# Patient Record
Sex: Female | Born: 1995 | Race: White | Hispanic: No | Marital: Single | Smoking: Never smoker
Health system: Southern US, Community
[De-identification: ages and names within clinical notes are randomized; demographics above are authoritative.]

---

## 2019-04-23 ENCOUNTER — Ambulatory Visit: Payer: Self-pay | Attending: Internal Medicine

## 2019-04-23 DIAGNOSIS — Z23 Encounter for immunization: Secondary | ICD-10-CM

## 2019-04-23 NOTE — Progress Notes (Signed)
   Covid-19 Vaccination Clinic  Name:  Sarah Sellers    MRN: [id number] DOB: July 23, 1995  04/23/2019  Sarah Sellers was observed post Covid-19 immunization for 15 minutes without incident. She was provided with Vaccine Information Sheet and instruction to access the V-Safe system.   Sarah Sellers was instructed to call 911 with any severe reactions post vaccine: Marland Kitchen Difficulty breathing  . Swelling of face and throat  . A fast heartbeat  . A bad rash all over body  . Dizziness and weakness   Immunizations Administered    Name Date Dose VIS Date Route   Pfizer COVID-19 Vaccine 04/23/2019  3:57 PM 0.3 mL 01/16/2019 Intramuscular   Manufacturer: [employer], [employer]   Lot: [id number]   NDC: [address]-2

## 2019-05-19 ENCOUNTER — Ambulatory Visit: Payer: Self-pay | Attending: Internal Medicine

## 2019-05-19 DIAGNOSIS — Z23 Encounter for immunization: Secondary | ICD-10-CM

## 2019-05-19 NOTE — Progress Notes (Signed)
   Covid-19 Vaccination Clinic  Name:  Marvetta Vohs    MRN: [id number] DOB: Dec 03, 1995  05/19/2019  Ms. Alcide Evener was observed post Covid-19 immunization for 15 minutes without incident. She was provided with Vaccine Information Sheet and instruction to access the V-Safe system.   Ms. Alcide Evener was instructed to call 911 with any severe reactions post vaccine: Marland Kitchen Difficulty breathing  . Swelling of face and throat  . A fast heartbeat  . A bad rash all over body  . Dizziness and weakness   Immunizations Administered    Name Date Dose VIS Date Route   Pfizer COVID-19 Vaccine 05/19/2019  4:43 PM 0.3 mL 01/16/2019 Intramuscular   Manufacturer: [employer], [employer]   Lot: [id number]   NDC: [address]-9

## 2020-05-29 ENCOUNTER — Emergency Department (HOSPITAL_BASED_OUTPATIENT_CLINIC_OR_DEPARTMENT_OTHER)
Admission: EM | Admit: 2020-05-29 | Discharge: 2020-05-29 | Disposition: A | Discharge status: Home / Self Care | Payer: No Typology Code available for payment source | Attending: Emergency Medicine | Admitting: Emergency Medicine

## 2020-05-29 ENCOUNTER — Emergency Department (HOSPITAL_BASED_OUTPATIENT_CLINIC_OR_DEPARTMENT_OTHER): Payer: No Typology Code available for payment source | Admitting: Radiology

## 2020-05-29 ENCOUNTER — Other Ambulatory Visit: Payer: Self-pay

## 2020-05-29 ENCOUNTER — Encounter (HOSPITAL_BASED_OUTPATIENT_CLINIC_OR_DEPARTMENT_OTHER): Payer: Self-pay | Admitting: Emergency Medicine

## 2020-05-29 ENCOUNTER — Emergency Department (HOSPITAL_BASED_OUTPATIENT_CLINIC_OR_DEPARTMENT_OTHER): Payer: No Typology Code available for payment source

## 2020-05-29 DIAGNOSIS — S199XXA Unspecified injury of neck, initial encounter: Secondary | ICD-10-CM | POA: Insufficient documentation

## 2020-05-29 DIAGNOSIS — T1490XA Injury, unspecified, initial encounter: Secondary | ICD-10-CM

## 2020-05-29 DIAGNOSIS — S5011XA Contusion of right forearm, initial encounter: Secondary | ICD-10-CM | POA: Diagnosis not present

## 2020-05-29 DIAGNOSIS — S0003XA Contusion of scalp, initial encounter: Secondary | ICD-10-CM | POA: Insufficient documentation

## 2020-05-29 DIAGNOSIS — Y92410 Unspecified street and highway as the place of occurrence of the external cause: Secondary | ICD-10-CM | POA: Diagnosis not present

## 2020-05-29 DIAGNOSIS — S40012A Contusion of left shoulder, initial encounter: Secondary | ICD-10-CM | POA: Diagnosis not present

## 2020-05-29 DIAGNOSIS — S50811A Abrasion of right forearm, initial encounter: Secondary | ICD-10-CM

## 2020-05-29 DIAGNOSIS — S4992XA Unspecified injury of left shoulder and upper arm, initial encounter: Secondary | ICD-10-CM | POA: Diagnosis present

## 2020-05-29 MED ORDER — KETOROLAC TROMETHAMINE 10 MG PO TABS
10.0000 mg | ORAL_TABLET | Freq: Once | ORAL | Status: AC
  Administered 2020-05-29: 10 mg via ORAL
  Filled 2020-05-29: qty 1
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
